# Patient Record
Sex: Female | Born: 1970 | Race: White | Hispanic: No | Marital: Married | Smoking: Current every day smoker
Health system: Southern US, Community
[De-identification: ages and names within clinical notes are randomized; demographics above are authoritative.]

## PROBLEM LIST (undated history)

## (undated) DIAGNOSIS — N280 Ischemia and infarction of kidney: Secondary | ICD-10-CM

## (undated) HISTORY — DX: Ischemia and infarction of kidney: N28.0

---

## 2002-06-17 HISTORY — PX: KNEE SURGERY: SHX244

## 2004-04-19 ENCOUNTER — Ambulatory Visit: Payer: Self-pay | Admitting: Orthopaedic Surgery

## 2004-06-17 HISTORY — PX: OVARY SURGERY: SHX727

## 2010-11-05 ENCOUNTER — Emergency Department: Payer: Self-pay | Admitting: Emergency Medicine

## 2012-03-03 ENCOUNTER — Ambulatory Visit: Payer: Self-pay

## 2012-06-17 HISTORY — PX: CHOLECYSTECTOMY: SHX55

## 2012-08-16 ENCOUNTER — Emergency Department: Payer: Self-pay | Admitting: Emergency Medicine

## 2012-08-17 LAB — URINALYSIS, COMPLETE
Bilirubin,UR: NEGATIVE
Ketone: NEGATIVE
Nitrite: POSITIVE
Ph: 6 (ref 4.5–8.0)
Protein: 100
RBC,UR: 58 /HPF (ref 0–5)
Squamous Epithelial: 2

## 2012-08-17 LAB — COMPREHENSIVE METABOLIC PANEL
Albumin: 3.6 g/dL (ref 3.4–5.0)
Alkaline Phosphatase: 111 U/L (ref 50–136)
Anion Gap: 7 (ref 7–16)
BUN: 13 mg/dL (ref 7–18)
Bilirubin,Total: 0.7 mg/dL (ref 0.2–1.0)
Calcium, Total: 9.4 mg/dL (ref 8.5–10.1)
Chloride: 100 mmol/L (ref 98–107)
Co2: 26 mmol/L (ref 21–32)
Creatinine: 0.9 mg/dL (ref 0.60–1.30)
EGFR (African American): >60
EGFR (Non-African Amer.): >60
Osmolality: 268 (ref 275–301)
Sodium: 133 mmol/L — ABNORMAL LOW (ref 136–145)
Total Protein: 8.5 g/dL — ABNORMAL HIGH (ref 6.4–8.2)

## 2012-08-17 LAB — CBC WITH DIFFERENTIAL/PLATELET
Eosinophil #: 0 10*3/uL (ref 0.0–0.7)
HCT: 43.4 % (ref 35.0–47.0)
Lymphocyte %: 6.4 %
MCH: 31.4 pg (ref 26.0–34.0)
MCV: 93 fL (ref 80–100)
Monocyte #: 0.8 x10 3/mm (ref 0.2–0.9)
Neutrophil %: 87.9 %
Platelet: 201 10*3/uL (ref 150–440)
RBC: 4.65 10*6/uL (ref 3.80–5.20)
RDW: 12.7 % (ref 11.5–14.5)
WBC: 14.5 10*3/uL — ABNORMAL HIGH (ref 3.6–11.0)

## 2012-08-18 ENCOUNTER — Inpatient Hospital Stay: Payer: Self-pay | Admitting: Family Medicine

## 2012-08-18 LAB — URINALYSIS, COMPLETE
Bilirubin,UR: NEGATIVE
Ketone: NEGATIVE
Ph: 6 (ref 4.5–8.0)
RBC,UR: 47 /HPF (ref 0–5)
Specific Gravity: 1.018 (ref 1.003–1.030)
WBC UR: 102 /HPF (ref 0–5)

## 2012-08-18 LAB — COMPREHENSIVE METABOLIC PANEL
Albumin: 3 g/dL — ABNORMAL LOW (ref 3.4–5.0)
Alkaline Phosphatase: 94 U/L (ref 50–136)
Anion Gap: 5 — ABNORMAL LOW (ref 7–16)
BUN: 9 mg/dL (ref 7–18)
Calcium, Total: 8.6 mg/dL (ref 8.5–10.1)
Creatinine: 0.85 mg/dL (ref 0.60–1.30)
EGFR (Non-African Amer.): >60
Glucose: 95 mg/dL (ref 65–99)
Osmolality: 261 (ref 275–301)
Potassium: 3.4 mmol/L — ABNORMAL LOW (ref 3.5–5.1)
SGOT(AST): 28 U/L (ref 15–37)
SGPT (ALT): 22 U/L (ref 12–78)
Sodium: 131 mmol/L — ABNORMAL LOW (ref 136–145)
Total Protein: 8.1 g/dL (ref 6.4–8.2)

## 2012-08-18 LAB — CBC
HCT: 38.3 % (ref 35.0–47.0)
MCHC: 35.2 g/dL (ref 32.0–36.0)
MCV: 92 fL (ref 80–100)
Platelet: 155 10*3/uL (ref 150–440)
RBC: 4.18 10*6/uL (ref 3.80–5.20)
WBC: 9.2 10*3/uL (ref 3.6–11.0)

## 2012-08-18 LAB — LIPASE, BLOOD: Lipase: 122 U/L (ref 73–393)

## 2012-08-20 LAB — BASIC METABOLIC PANEL
BUN: 6 mg/dL — ABNORMAL LOW (ref 7–18)
Co2: 25 mmol/L (ref 21–32)
EGFR (African American): >60
EGFR (Non-African Amer.): >60
Glucose: 79 mg/dL (ref 65–99)
Osmolality: 270 (ref 275–301)
Potassium: 3.6 mmol/L (ref 3.5–5.1)
Sodium: 137 mmol/L (ref 136–145)

## 2012-08-20 LAB — CBC WITH DIFFERENTIAL/PLATELET
Basophil #: 0 10*3/uL (ref 0.0–0.1)
Basophil %: 0.3 %
Eosinophil #: 0 10*3/uL (ref 0.0–0.7)
HGB: 11.1 g/dL — ABNORMAL LOW (ref 12.0–16.0)
Lymphocyte #: 0.8 10*3/uL — ABNORMAL LOW (ref 1.0–3.6)
Lymphocyte %: 25.7 %
MCH: 31.7 pg (ref 26.0–34.0)
MCHC: 34.7 g/dL (ref 32.0–36.0)
MCV: 91 fL (ref 80–100)
Monocyte %: 10.1 %
Platelet: 142 10*3/uL — ABNORMAL LOW (ref 150–440)
RBC: 3.51 10*6/uL — ABNORMAL LOW (ref 3.80–5.20)
RDW: 12.6 % (ref 11.5–14.5)
WBC: 3.1 10*3/uL — ABNORMAL LOW (ref 3.6–11.0)

## 2012-08-20 LAB — URINE CULTURE

## 2012-08-22 LAB — CULTURE, BLOOD (SINGLE)

## 2012-11-04 ENCOUNTER — Ambulatory Visit: Payer: Self-pay | Admitting: Family Medicine

## 2012-11-05 ENCOUNTER — Ambulatory Visit (INDEPENDENT_AMBULATORY_CARE_PROVIDER_SITE_OTHER): Payer: 59 | Admitting: General Surgery

## 2012-11-05 ENCOUNTER — Encounter: Payer: Self-pay | Admitting: General Surgery

## 2012-11-05 ENCOUNTER — Ambulatory Visit: Payer: Self-pay | Admitting: General Surgery

## 2012-11-05 VITALS — BP 126/70 | HR 88 | Resp 16 | Ht 65.0 in | Wt 125.0 lb

## 2012-11-05 DIAGNOSIS — K810 Acute cholecystitis: Secondary | ICD-10-CM | POA: Insufficient documentation

## 2012-11-05 DIAGNOSIS — K8000 Calculus of gallbladder with acute cholecystitis without obstruction: Secondary | ICD-10-CM | POA: Insufficient documentation

## 2012-11-05 NOTE — Patient Instructions (Addendum)
The patient is aware to call back for any questions or concerns. Laparoscopic cholecystectomy booklet

## 2012-11-05 NOTE — Progress Notes (Signed)
Patient ID: Katelyn Green, female   DOB: January 17, 1971, 42 y.o.   MRN: [id number]  Chief Complaint  Patient presents with  . Abdominal Pain    HPI Christiona Siddique is a 42 y.o. female.   Patient here today for evaluation of her gall gladder referred by Dr Dayna Barker.  Patient complaints of abdominal pain for about 1 week upper right quadrant and nausea.  She has bloating as well. She has had a few episodes since January that only lasted a couple of days but this one seems to be lasting longer.  CT done 11-04-12 that showed gall stones. She had an episode 2 years ago where she went to the ER. HPI  Past Medical History  Diagnosis Date  . Kidney infarction     Past Surgical History  Procedure Laterality Date  . Knee surgery  2004  . Ovary surgery Right 2006    History reviewed. No pertinent family history.  Social History History  Substance Use Topics  . Smoking status: Current Every Day Smoker  . Smokeless tobacco: Never Used  . Alcohol Use: Yes    Allergies  Allergen Reactions  . Codeine Nausea And Vomiting    Current Outpatient Prescriptions  Medication Sig Dispense Refill  . Norgestimate-Eth Estradiol (SPRINTEC 28 PO) Take by mouth.      . venlafaxine (EFFEXOR) 25 MG tablet Take 25 mg by mouth 2 (two) times daily.       No current facility-administered medications for this visit.    Review of Systems Review of Systems  Constitutional: Negative.   Respiratory: Negative.   Cardiovascular: Negative.     Blood pressure 126/70, pulse 88, resp. rate 16, height 5\' 5"  (1.651 m), weight 125 lb (56.7 kg), last menstrual period 10/27/2012.  Physical Exam Physical Exam  Constitutional: She is oriented to person, place, and time. She appears well-developed and well-nourished.  Eyes: Conjunctivae are normal.  Cardiovascular: Normal rate and regular rhythm.   Pulmonary/Chest: Effort normal and breath sounds normal.  Abdominal: Soft. There is tenderness. A hernia is present.   Neurological: She is alert and oriented to person, place, and time.  Skin: Skin is warm and dry.  mild moderate tenderness RUQ small umbilical hernia  Data Reviewed CT reviewed -shows distended gall bladder with wall thickening with pericholocystic fluid. Prior US from 2012 showed gallstones  Assessment    Acute cholecystitis with cholelithiasis     Plan    Plan surgery. Discussed lap/cholecystectomy in full with pt.       SANKAR,SEEPLAPUTHUR G 11/05/2012, 11:38 AM

## 2012-11-06 ENCOUNTER — Ambulatory Visit: Payer: Self-pay | Admitting: General Surgery

## 2012-11-06 DIAGNOSIS — K429 Umbilical hernia without obstruction or gangrene: Secondary | ICD-10-CM

## 2012-11-06 DIAGNOSIS — K8000 Calculus of gallbladder with acute cholecystitis without obstruction: Secondary | ICD-10-CM

## 2012-11-10 ENCOUNTER — Encounter: Payer: Self-pay | Admitting: General Surgery

## 2012-11-11 ENCOUNTER — Ambulatory Visit (INDEPENDENT_AMBULATORY_CARE_PROVIDER_SITE_OTHER): Payer: 59 | Admitting: General Surgery

## 2012-11-11 ENCOUNTER — Encounter: Payer: Self-pay | Admitting: General Surgery

## 2012-11-11 VITALS — BP 122/68 | HR 80 | Resp 12 | Ht 65.0 in | Wt 126.0 lb

## 2012-11-11 DIAGNOSIS — K810 Acute cholecystitis: Secondary | ICD-10-CM

## 2012-11-11 DIAGNOSIS — K8000 Calculus of gallbladder with acute cholecystitis without obstruction: Secondary | ICD-10-CM

## 2012-11-11 NOTE — Patient Instructions (Addendum)
Patient to use a laxative prn-dulcolax MOM or Miralax.  If labs are normal, no further w/u for ruq soreness. May want to try some AIs-Aleve or Advil.

## 2012-11-11 NOTE — Progress Notes (Signed)
Patient ID: Katelyn Green, female   DOB: [birth year], 42 y.o.   MRN: [id number]  Chief Complaint  Patient presents with  . Routine Post Op    gallbladder    HPI Katelyn Green is a 42 y.o. female here today for her post op gallbladder surgery done 11/06/12. Patient states she has not had a bowel movement since last Tuesday. C/o focal soreness near lateral portsites HPI  Past Medical History  Diagnosis Date  . Kidney infarction     Past Surgical History  Procedure Laterality Date  . Knee surgery  2004  . Ovary surgery Right 2006  . Cholecystectomy  2014    History reviewed. No pertinent family history.  Social History History  Substance Use Topics  . Smoking status: Current Every Day Smoker  . Smokeless tobacco: Never Used  . Alcohol Use: Yes    Allergies  Allergen Reactions  . Codeine Nausea And Vomiting    Current Outpatient Prescriptions  Medication Sig Dispense Refill  . Norgestimate-Eth Estradiol (SPRINTEC 28 PO) Take by mouth.      . oxyCODONE-acetaminophen (PERCOCET/ROXICET) 5-325 MG per tablet Take 40 tablets by mouth daily as needed.      . venlafaxine (EFFEXOR) 25 MG tablet Take 25 mg by mouth 2 (two) times daily.       No current facility-administered medications for this visit.    Review of Systems Review of Systems  Constitutional: Negative.   Respiratory: Negative.   Cardiovascular: Negative.   Gastrointestinal: Positive for abdominal pain and constipation. Negative for nausea, vomiting, diarrhea and abdominal distention.    Blood pressure 122/68, pulse 80, resp. rate 12, height 5\' 5"  (1.651 m), weight 126 lb (57.153 kg), last menstrual period 10/27/2012.  Physical Exam Physical Exam  Constitutional: She appears well-developed and well-nourished.  Cardiovascular: Normal rate, regular rhythm and normal heart sounds.   Pulmonary/Chest: Effort normal and breath sounds normal.  Abdominal: Soft. Bowel sounds are normal. There is tenderness ( on  lateral port site  ).  t Data Reviewed None   Assessment    Overall doing well, soreness RUQ might be muscular.     Plan   CBC and liver functions        Katelyn Green 11/12/2012, 9:43 AM

## 2012-11-12 ENCOUNTER — Encounter: Payer: Self-pay | Admitting: General Surgery

## 2012-11-12 ENCOUNTER — Telehealth: Payer: Self-pay | Admitting: *Deleted

## 2012-11-12 LAB — CBC WITH DIFFERENTIAL/PLATELET
Basophils Absolute: 0 10*3/uL (ref 0.0–0.2)
Basos: 0 % (ref 0–3)
Eosinophils Absolute: 0.1 10*3/uL (ref 0.0–0.4)
Immature Grans (Abs): 0 10*3/uL (ref 0.0–0.1)
Immature Granulocytes: 0 % (ref 0–2)
Lymphs: 13 % — ABNORMAL LOW (ref 14–46)
MCH: 30.7 pg (ref 26.6–33.0)
Monocytes Absolute: 0.8 10*3/uL (ref 0.1–0.9)
Neutrophils Relative %: 75 % — ABNORMAL HIGH (ref 40–74)
RBC: 4.4 x10E6/uL (ref 3.77–5.28)
RDW: 12.2 % — ABNORMAL LOW (ref 12.3–15.4)
WBC: 7.8 10*3/uL (ref 3.4–10.8)

## 2012-11-12 LAB — HEPATIC FUNCTION PANEL
AST: 28 IU/L (ref 0–40)
Albumin: 3.7 g/dL (ref 3.5–5.5)
Alkaline Phosphatase: 190 IU/L — ABNORMAL HIGH (ref 39–117)
Bilirubin, Direct: 0.13 mg/dL (ref 0.00–0.40)

## 2012-11-12 NOTE — Telephone Encounter (Signed)
Message copied by Currie Paris on Thu Nov 12, 2012 10:20 AM ------      Message from: Kieth Brightly      Created: Thu Nov 12, 2012  8:24 AM       No significant lab abnormalities.       Mindi Junker, please advise pt - to call if pain in ruq is not better by next week or she develops new symptoms. Suggest use of Aleve or Advil for 4-5 days. ------

## 2012-11-12 NOTE — Telephone Encounter (Signed)
Advised patient as instructed. 

## 2012-11-12 NOTE — Telephone Encounter (Signed)
Message copied by Currie Paris on Thu Nov 12, 2012  9:30 AM ------      Message from: Kieth Brightly      Created: Thu Nov 12, 2012  8:24 AM       No significant lab abnormalities.       Mindi Junker, please advise pt - to call if pain in ruq is not better by next week or she develops new symptoms. Suggest use of Aleve or Advil for 4-5 days. ------

## 2012-12-04 ENCOUNTER — Ambulatory Visit: Payer: Self-pay | Admitting: Family Medicine

## 2013-01-08 ENCOUNTER — Encounter: Payer: Self-pay | Admitting: General Surgery

## 2014-04-18 ENCOUNTER — Encounter: Payer: Self-pay | Admitting: General Surgery

## 2014-05-18 IMAGING — CT CT ABD-PELV W/O CM
1 of 2 series · 15 of 32 positions shown, 19 images · non-contrast
Comparison: none

REASON FOR EXAM: (1) left flank pain; (2) left flank pain
COMMENTS:   May transport without cardiac monitor

PROCEDURE:     CT  - CT ABDOMEN AND PELVIS W[DATE]  [DATE]
RESULT:     Abdominal CT dated
TECHNIQUE: Helical noncontrasted 3 mm sections were obtained the lung bases
through the pubic symphysis.

[Series 2: 3mm soft tissue · axial · 0.63mm/px · z∈[-778,-384]mm · 15 of 143 slices shown, 19 images]
[im 6/143  soft-tissue]
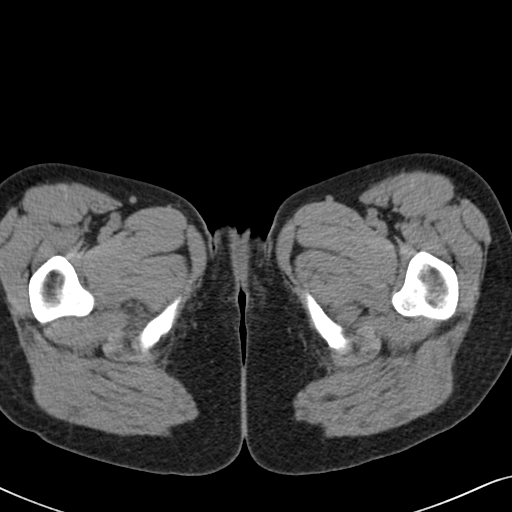
[im 6/143  bone]
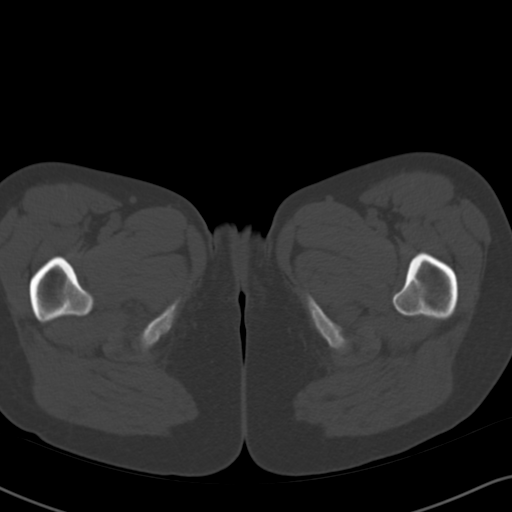
[im 17/143  soft-tissue]
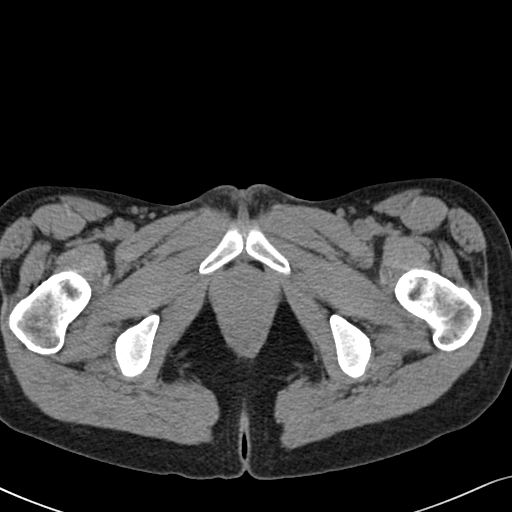
[im 28/143  soft-tissue]
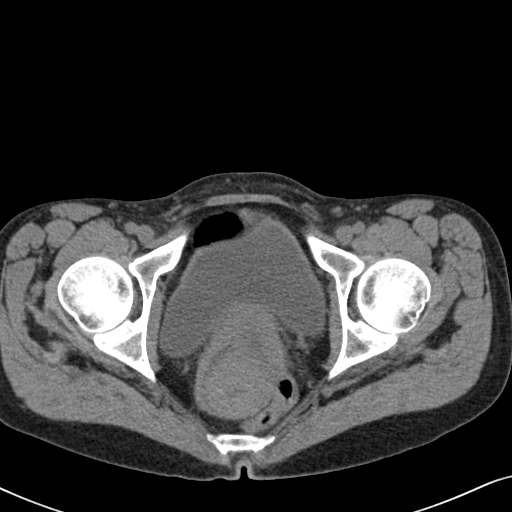
[im 39/143  soft-tissue]
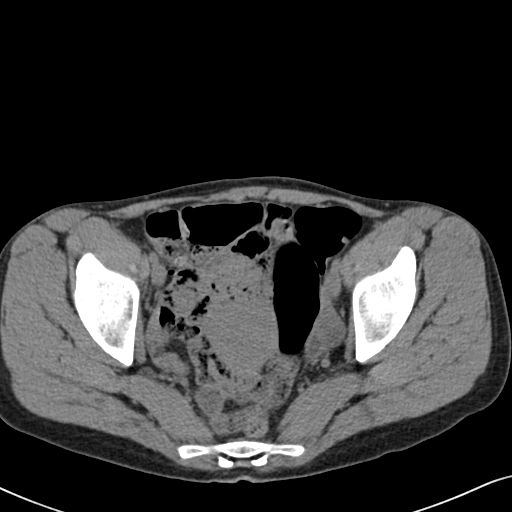
[im 50/143  soft-tissue]
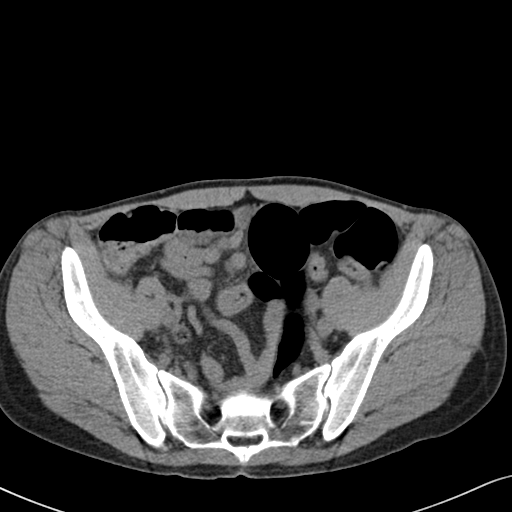
[im 61/143  soft-tissue]
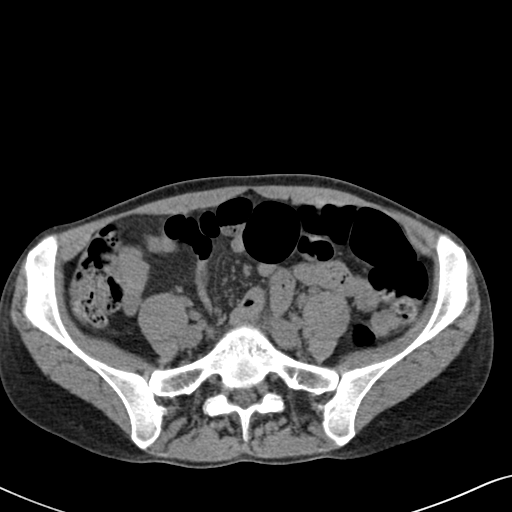
[im 72/143  soft-tissue]
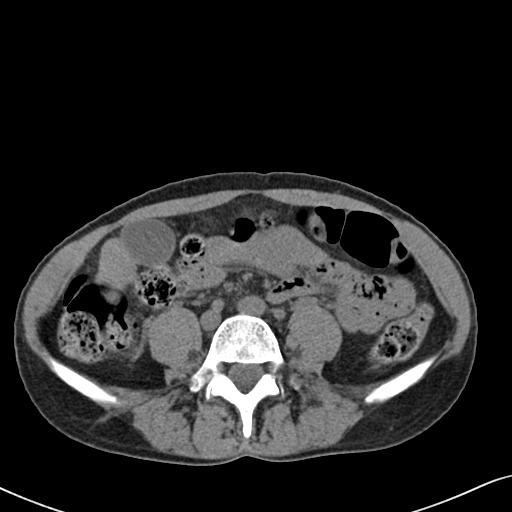
[im 82/143  soft-tissue]
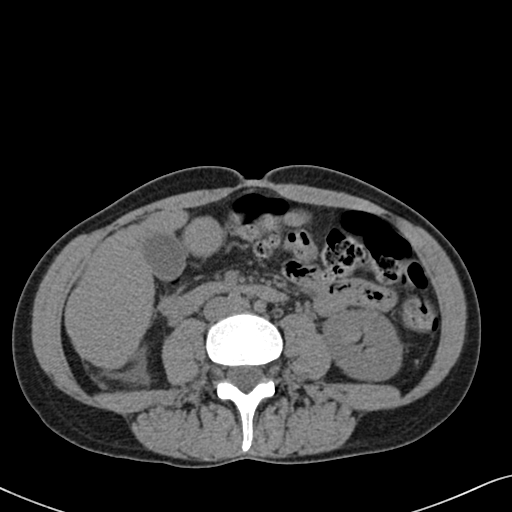
[im 93/143  soft-tissue]
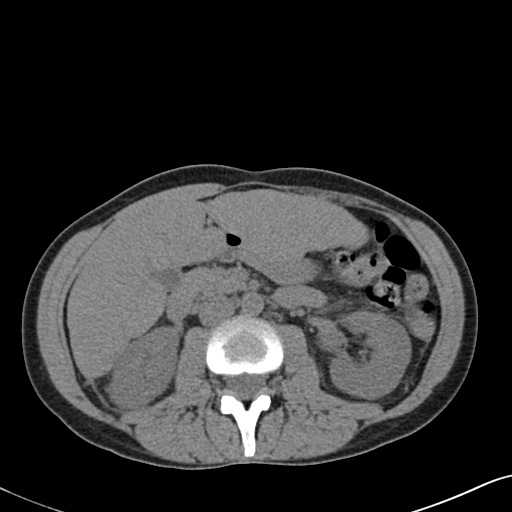
[im 93/143  bone]
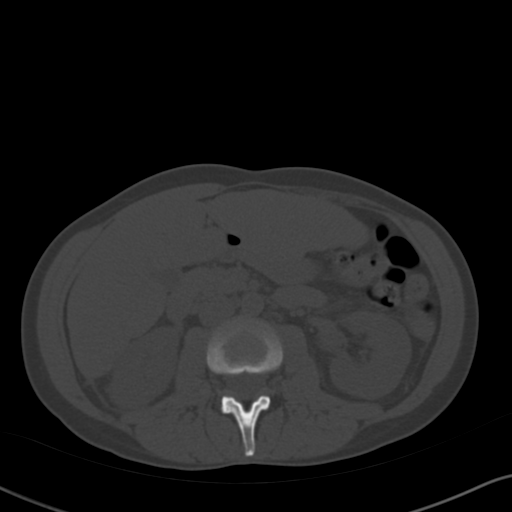
[im 104/143  soft-tissue]
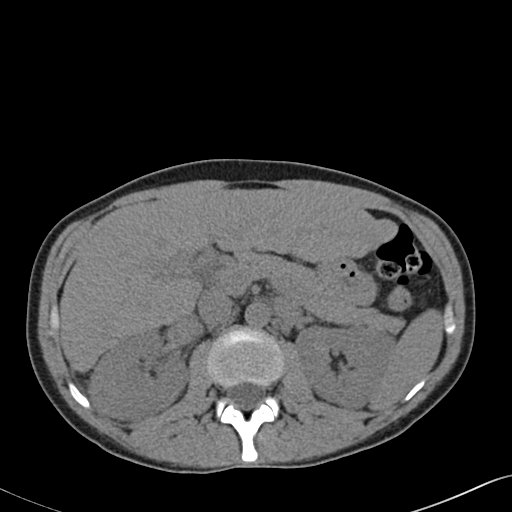
[im 115/143  soft-tissue]
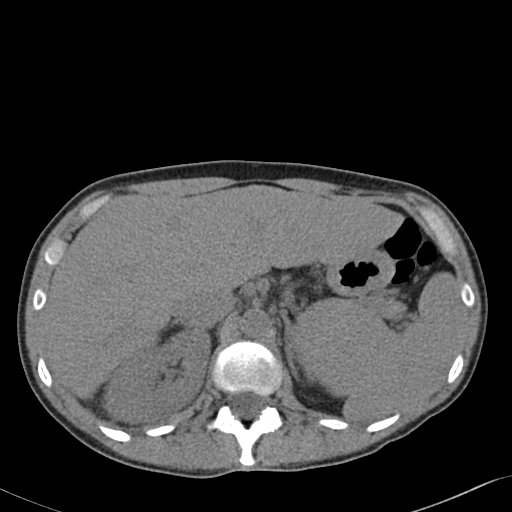
[im 121/143  lung]
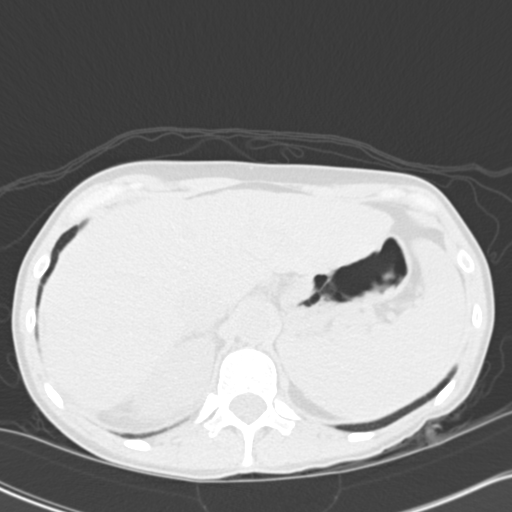
[im 126/143  soft-tissue]
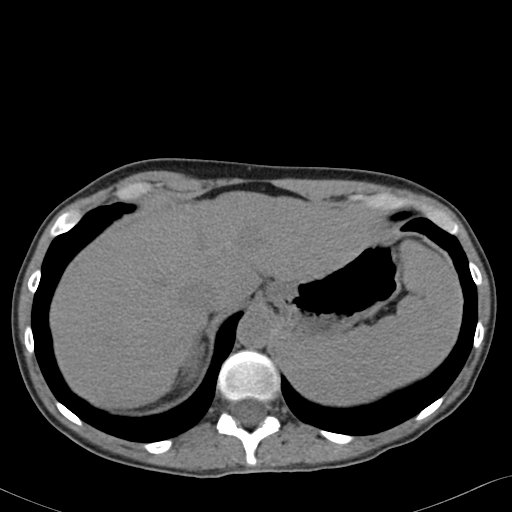
[im 126/143  lung]
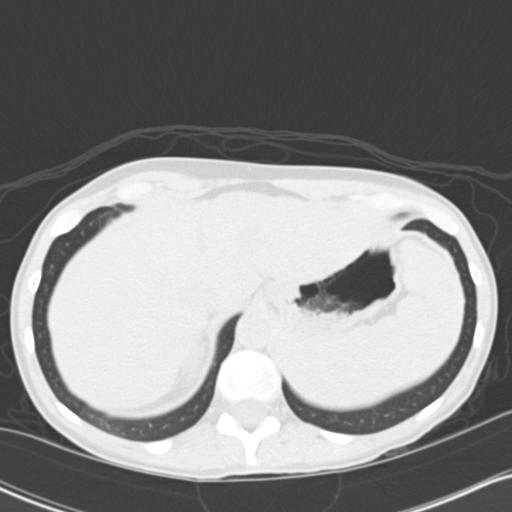
[im 132/143  lung]
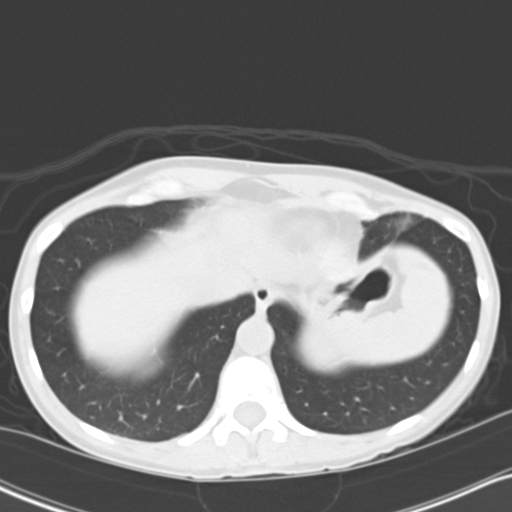
[im 137/143  soft-tissue]
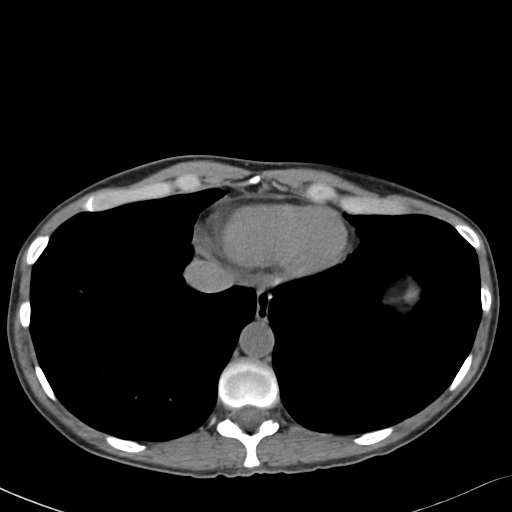
[im 137/143  lung]
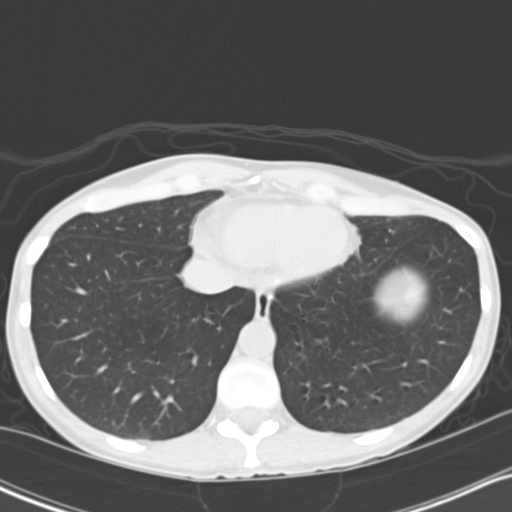

[15 of 32 positions shown; findings below may reference images not displayed]

FINDINGS: The lung bases are unremarkable.

Noncontrast evaluation of the liver, adrenals, pancreas, kidneys is
unremarkable. The spleen is prominent measuring 12.4 cm in longitudinal
dimensions. Within the posterior aspect right hilum a masslike area is
identified this may represent the normal contour of the spleen though a
focal splenic mass cannot be excluded and is poorly characterized on this
noncontrasted study.

There is no CT evidence of bowel obstruction, enteritis, colitis,
diverticulitis, nor appendicitis within the limitations of a noncontrasted
CT. There is no evidence of abdominal aortic aneurysm. There is no further
evidence of masses, loculated fluid collections nor evidence of free fluid
within the limitations of a noncontrasted CT. There are prominent
retroperitoneal and mesenteric lymph nodes. There is a moderate to large
amount of fecal retention within the colon.
IMPRESSION: 1. Prominent spleen and retroperitoneal as well as mesenteric lymph nodes as
described above clinical correlation recommended
2. Otherwise no CT evidence of obstructive or inflammatory abnormalities.

## 2014-05-20 IMAGING — CR DG CHEST 1V
1 series · 1 of 1 positions shown · non-contrast
Comparison: none

REASON FOR EXAM: shortness of breath
COMMENTS:

[ap]
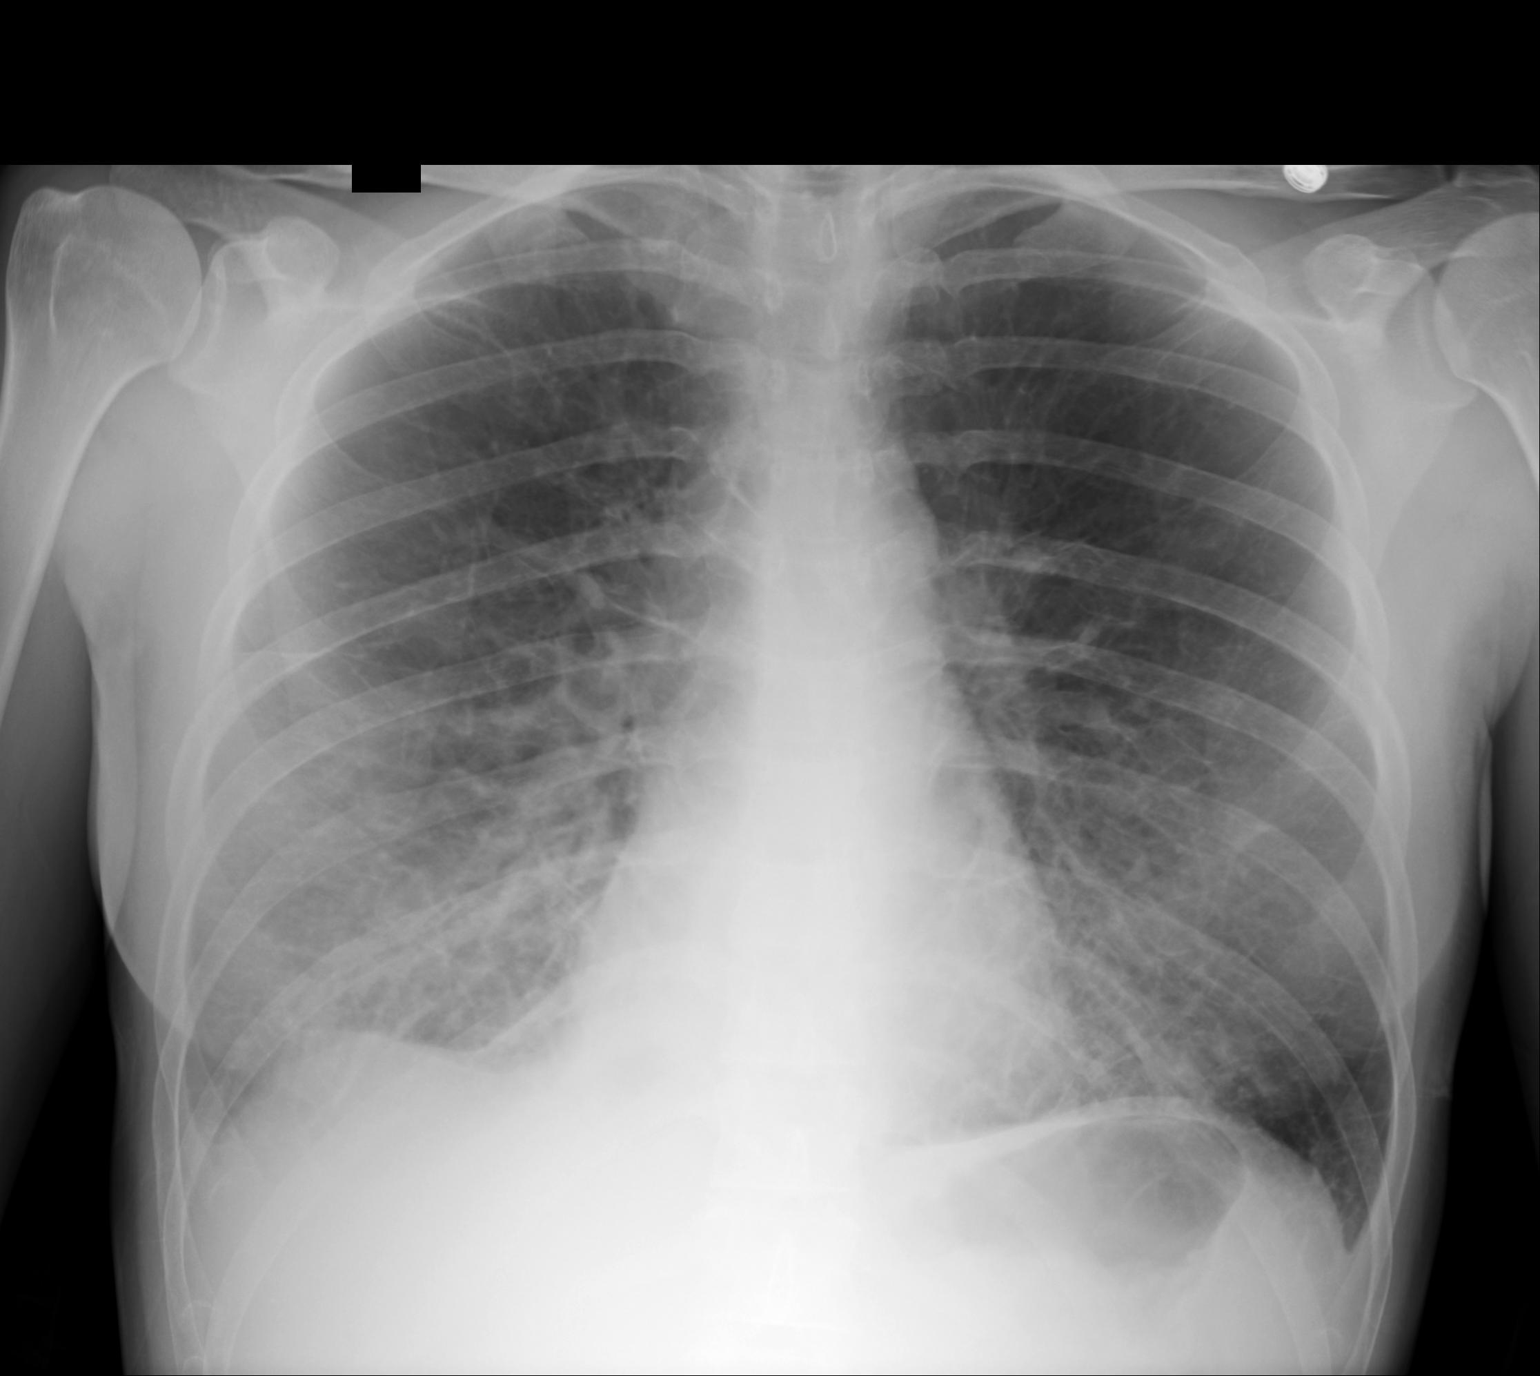

[1 of 1 positions shown; findings below may reference images not displayed]

PROCEDURE:     DXR - DXR CHEST 1 VIEWAP OR PA  - August 20, 2012  [DATE]

RESULT:     Followup PA and lateral views the chest are recommended. There
is mild diffuse interstitial prominence especially in the lower lung zones
area the heart size is normal. No definite consolidation, effusion or
pneumothorax is evident.
IMPRESSION: Fairly significant interstitial prominence especially of
the mid and lower right lung region. Some of this may be because of the
portable technique and exposure factors. Followup erect PA and lateral views
the chest would be recommended when the patient's condition permits.

[REDACTED]

## 2014-08-06 IMAGING — XA DG CHOLANGIOGRAM OPERATIVE
1 series · 2 of 2 positions shown · non-contrast
Comparison: none

REASON FOR EXAM: Cholelithiasis
COMMENTS:

[Series 6001: (person_name) · 2 of 2 slices shown]
[im 1/2]
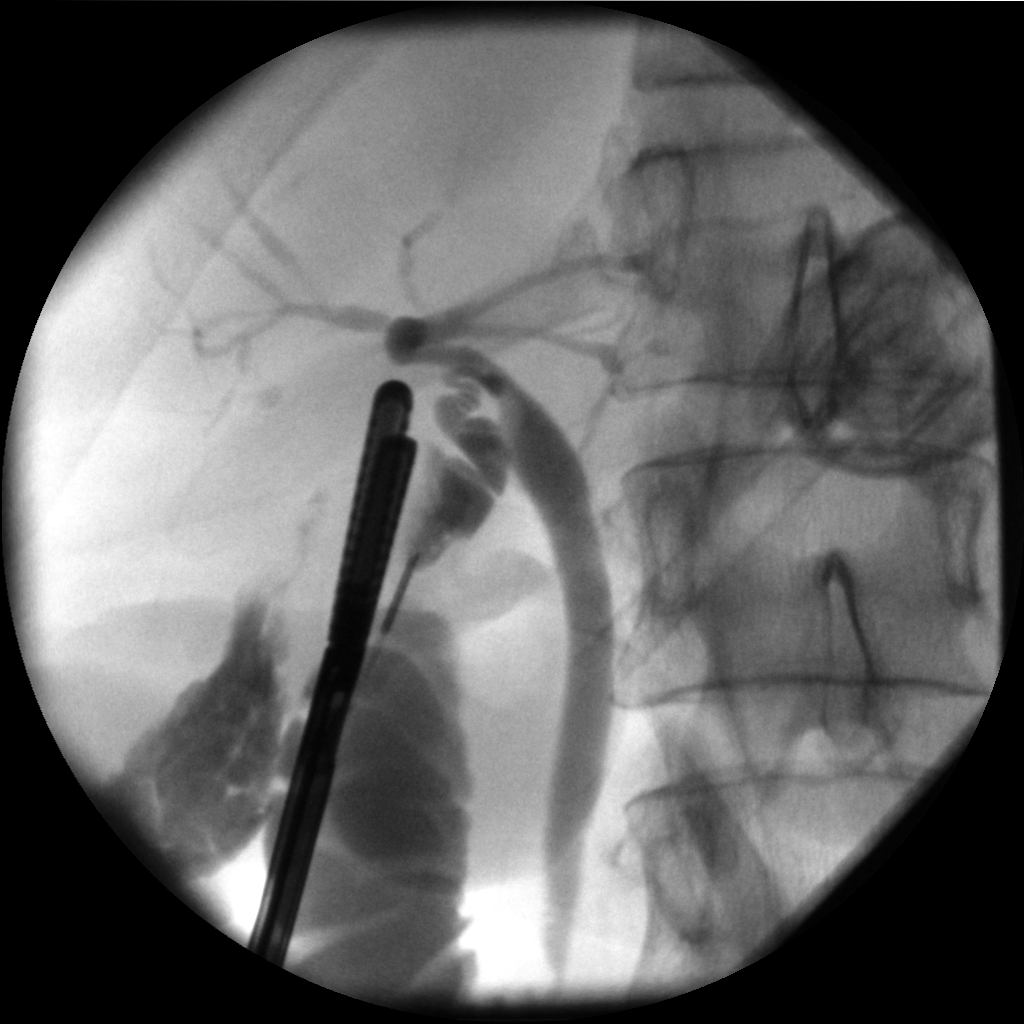
[im 2/2]
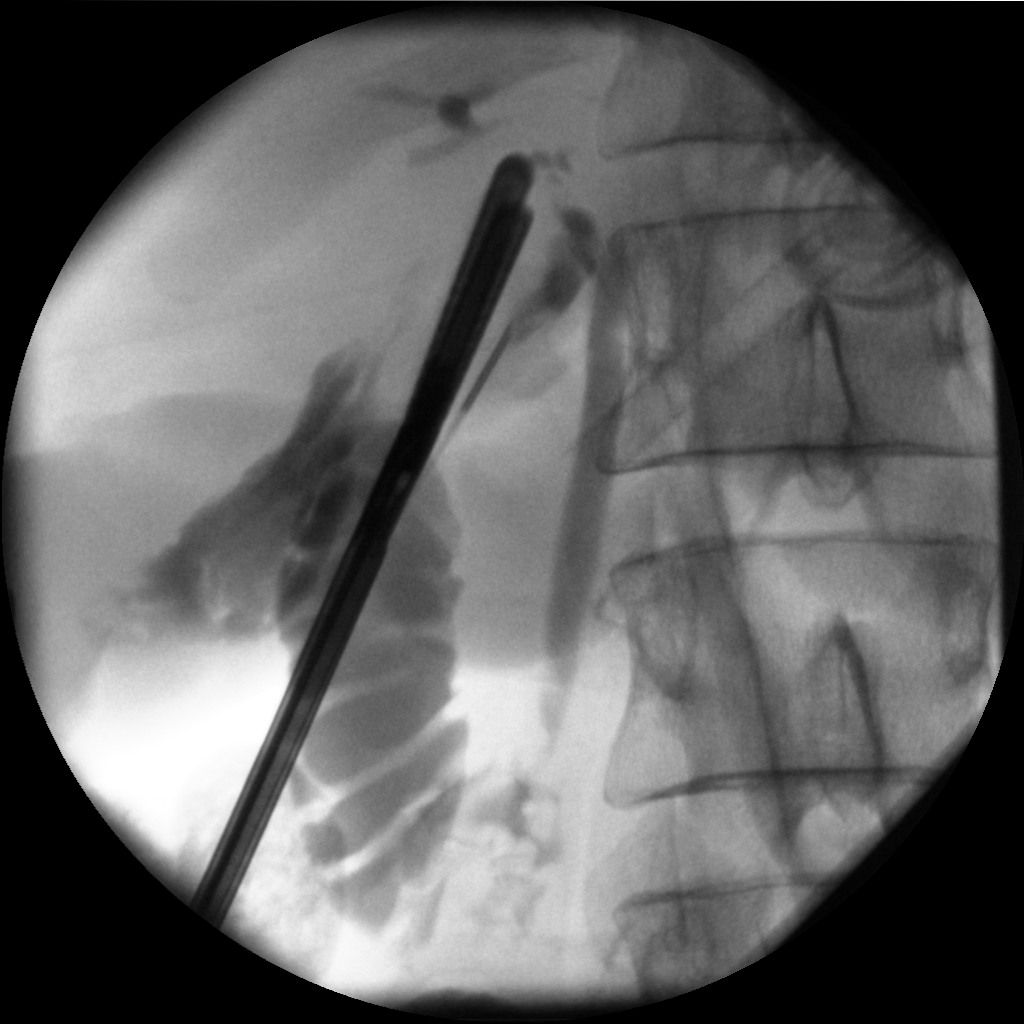

[2 of 2 positions shown; findings below may reference images not displayed]

PROCEDURE:     DXR - DXR CHOLANGIOGRAM OP (INITIAL)  - November 06, 2012 [DATE]

RESULT:     Two spot films from a laparoscopic cholecystectomy are
submitted. The visualized portions of the intrahepatic ducts and common
hepatic duct appear normal. The common bile duct is normal and diameter and
contour. No filling defects are demonstrated to suggest a retained stone.
There is contrast within the cystic duct and within the duodenum.
IMPRESSION: I do not see evidence of a retained common bile duct stone.
Further interpretation is deferred to Dr. Stephen Vincent

[REDACTED].

## 2014-10-07 NOTE — H&P (Signed)
PATIENT NAME:  Katelyn Green, Katelyn Green MR#:  [id number] DATE OF BIRTH:  [birth year]  DATE OF ADMISSION:  08/18/2012  PRIMARY CARE PHYSICIAN: Benjamine Mola B. White, NP   CHIEF COMPLAINT: Nausea, vomiting and abdominal pain.   HISTORY OF PRESENT ILLNESS: A 44 year old female patient with history of depression who presents to the Emergency Room complaining of intractable nausea, vomiting and left flank and back pain radiating to the groin. The patient was seen yesterday in the Emergency Room on 08/17/2012 with similar complaints, was sent home after being diagnosed with UTI and pyelonephritis on ciprofloxacin. She also got a prescription for Percocet. The patient has been unable to keep any food or liquids down. She could not keep her antibiotics down. Returned to the Emergency Room and is being admitted to the hospitalist service for aggressive IV fluids and IV antibiotics, which she cannot get at home. The patient is afebrile. Her white count is improved, but urinalysis still show UTI. The patient had a CT scan of the abdomen done without contrast which shows no stones, no perinephric stranding or abscess, but incidental finding of splenomegaly of about 12 cm.   The patient's pain is in the left flank/back radiating to the groin with no aggravating or relieving factors. This pain has not improved since yesterday. She has not noticed any hematemesis in the vomiting. Has dark urine but no hematuria. Her burning on urination has definitely improved.   PAST MEDICAL HISTORY: Depression.   FAMILY HISTORY: Hypertension.   SOCIAL HISTORY: The patient smokes a pack a day. No alcohol. No illicit drugs.   REVIEW OF SYSTEMS:    CONSTITUTIONAL: Complains of fever, fatigue, weakness. No weight loss, weight gain.  EYES: No blurred vision, pain, redness.  ENT: No tinnitus, ear pain, hearing loss.  RESPIRATORY: No cough, wheeze, hemoptysis.  CARDIOVASCULAR: No chest pain, orthopnea, edema.  GASTROINTESTINAL: Has nausea,  vomiting. No diarrhea. Does have abdominal pain.  GENITOURINARY: Had dysuria which has resolved. No hematuria or frequency.  ENDOCRINE: No polyuria, nocturia, thyroid problems.  HEMATOLOGIC AND LYMPHATIC: No anemia, easy bruising, bleeding.  INTEGUMENTARY: No acne, rash, lesions.  MUSCULOSKELETAL: No arthritis, gout, redness.  NEUROLOGIC: No focal numbness, weakness, dysarthria, seizures.  PSYCHIATRIC: Has depression. No anxiety.   HOME MEDICATIONS: Include: 1.  Effexor 300 mg oral once a day.  2.  Percocet 5/325, 1 tablet oral every 6 hours.  3.  Zofran ODT 4 mg oral 3 times a day as needed.  4.  Cipro 500 mg oral 2 times a day for 10 days.  5.  Sprintec 35/0.25 mg oral once a day.   PHYSICAL EXAMINATION:  VITAL SIGNS: Temperature 99.1, pulse 116, respirations 20, blood pressure 113/76, saturating 99% on room air.  GENERAL: Moderately built Caucasian female patient lying in bed in distress secondary to her pain and nausea.  PSYCHIATRIC: Alert, oriented x 3. Mood and affect appropriate. Judgment intact.  HEENT: Atraumatic, normocephalic. Oral mucosa dry and pink. No oral ulcers or thrush. External ears and nose normal. No pallor. No icterus. Pupils bilaterally equal and reactive to light.  NECK: Supple. No thyromegaly. No palpable lymph nodes. Trachea midline. No carotid bruit or JVD.  CARDIOVASCULAR: S1, S2, tachycardic without any murmurs. Peripheral pulses 2+.  RESPIRATORY: Normal work of breathing. Clear to auscultation on both sides.  GASTROINTESTINAL: Soft abdomen. Tenderness in the left flank area without any rigidity or guarding. Bowel sounds present.  GENITOURINARY: No CVA tenderness. Has suprapubic pain. No bladder distention.  SKIN: Warm and dry. No  petechiae, rash, ulcers.  MUSCULOSKELETAL: No joint swelling, redness, effusion of the large joints. Normal muscle tone.  NEUROLOGICAL: Motor strength 5/5 in upper and lower extremities. Sensation to fine touch intact all over.   LYMPHATIC: No cervical lymphadenopathy.   LABORATORY, DIAGNOSTIC AND RADIOLOGICAL DATA: Glucose 95, BUN 9, creatinine 0.85 with sodium 131, potassium 3.4. AST, ALT, alkaline phosphatase, bilirubin normal. WBC 9.2, hemoglobin 13.5 with platelets of 155. Urine culture from 08/17/2012 is growing gram-negative rods with blood cultures being negative. Urinalysis repeated today shows 102 WBCs, 2+ bacteria and 47 RBC. Pregnancy test done yesterday was negative.   CT scan of the abdomen and pelvis without contrast shows a prominent spleen and retroperitoneal and mesenteric lymph nodes. Moderate to large amount of fecal retention within the colon.   ASSESSMENT AND PLAN:  1.  Gram-negative rod urinary tract infection with associated nausea and vomiting: The nausea, vomiting I suspect is secondary to urinary tract infection and the pain, although significant amount of fecal matter found in the colon and constipation cannot be ruled out as a cause. Will admit the patient for inpatient treatment, as the patient is unable to keep her antibiotics down, is tachycardic and severely dehydrated. Will start her on aggressive intravenous fluids with 150 mL NR. After a bolus, start her on intravenous ceftriaxone and follow cultures to wait for identification and sensitivities from the urine. Also wait for final blood culture results. The patient does not have any costovertebral angle tenderness. No perinephric stranding or renal abscess.  2.  Splenomegaly, mild: No abnormalities found on the CBC. No prior history of blood dyscrasia or spleen issues. Will need to be monitored. Followup CT of the abdomen with contrast as outpatient if no improvement of symptoms with treatment of the urinary tract infection.  3.  Depression: Continue Effexor.  4.  Severe dehydration secondary to nausea, vomiting.  5.  Hyponatremia and hypokalemia secondary to dehydration and decreased intake: Replace.  6.  Deep vein thrombosis prophylaxis with  Lovenox.  Tobacco abuse- pt couselled to quit > 3 min 7.  Full code.   TIME SPENT: Time spent today on this case was 45 minutes.   ____________________________ Leia Alf Sudini, MD srs:jm D: 08/18/2012 17:59:41 ET T: 08/18/2012 18:23:49 ET JOB#: [id number]  cc: Alveta Heimlich R. Sudini, MD, <Dictator> Dani Gobble. Dema Severin, NP Alveta Heimlich Arlice Colt MD ELECTRONICALLY SIGNED 08/18/2012 19:15

## 2014-10-07 NOTE — Discharge Summary (Signed)
DATE OF BIRTH:  1971/04/28  DATE OF ADMISSION:  08/18/2012  DATE OF DISCHARGE:  08/20/2012   REASON FOR ADMISSION: Nausea, vomiting and abdominal pain.  DISCHARGE DIAGNOSES:  1.  Urinary tract infection. 2.  E. Coli sensitive to multiple antibiotics. 3.  Atelectasis. 4.  Hypokalemia. 5.  Right lower quadrant abdominal pain due to UTI.  6.  Infected spleen and lymph nodes due to infectious process.   7.  Hyponatremia due to intravascular volume depletion.   DISPOSITION:  Home.   MEDICATIONS AT DISCHARGE:  Zofran 4 mg 3 times a day as needed, Effexor XL 300 mg once a day, Sprintec 35/0.25 mg tablet once a day, acetaminophen p.r.n., hydrocodone with acetaminophen, Norco 5/325 mg take one 4-6 hours as needed for pain, Ceftin 500 mg twice daily,  FOLLOWUP:  With Dr. Dema Severin, family physician, in 1 to 2 weeks.   IMPORTANT RESULTS:  CT of the abdomen and pelvis without any signs of appendicitis. There is some slight enlargement of the spleen and retroperitoneal as well as mesenteric lymph nodes, otherwise was negative. There is enlargement of the spleen and mesenteric and retroperitoneal lymph nodes, could be related to the infectious process, as the patient had significant systemic inflammatory syndrome due to UTI, although we recommend to followup a CT scan of the abdomen and pelvis within the next 3 to 6 months. Dr. Dema Severin, primary care physician, to follow up on those results and order the tests.   Other results:  Glucose 121, creatinine 0.9 on admission, 0.6 at discharge, sodium 131, up to 137 after hydration. Total protein 8.5, albumin after hydration was 3.0. LFTs within normal limits. White blood count of 14.5 on admission, second day 9.2, at discharge 3.1.   Labs on discharge: White count 3.1, hematocrit 32, hemoglobin 11.1, likely due to dilution, as the platelets were up to 142. Urine culture grew E. coli sensitive to most of the classes of antibiotics. The patient was previously taking  ciprofloxacin, for what she is discharged on Ceftin.  Pregnancy test is negative.   HOSPITAL COURSE: The patient is a very nice, 44 year old female who has a history of depression, for the most controlled, presented to the Emergency Room complaining of intractable nausea and vomiting, left flank and back pain radiating to the groin. The patient was prior seen in the Emergency Room the day before with similar complaints, was diagnosed with UTI, and was prescribed ciprofloxacin and sent home with a prescription of Percocet as well. The patient developed significant nausea and vomiting after that, was not able to keep anything down, not even her antibiotics, for what she returned to be evaluated at the ER. Once she returned, she was afebrile. Her white count was 14,000, now it was 9.2, but her urinalysis does show a UTI. CT of the abdomen and pelvis was done, showing no signs of pyelonephritis, no signs of appendicitis, no abscess or acute findings. The only incidental finding was the splenomegaly with mesenteric lymph nodes, for what was likely the patient had reactive  reaction from the infection.   MEDICAL PROBLEMS: 1. Urinary tract infection. The patient was admitted with significant systemic symptoms, increased white blood cells and fever prior day. Was put on ciprofloxacin, sent home, but had to return due to increase of the symptomatology. The patient was found to be tachycardic and severely dehydrated, with low sodium, for which she was given intravenous fluids at 110 mL an hour. Those were decreased to 100 mL an hour the following day,  and stopped the day of discharge. The patient recovered very well, and her cultures grew out E. coli, which was pansensitive. She was discharged on Ceftin.   2.  Splenomegaly. The patient has no history of tumors, lymphoma, or any other blood dyscrasias. This is likely due to reactive disease from the infection, but we recommend to follow up with a CT of the abdomen as an  outpatient in the next 3 months after the UTI symptoms have resolved.   3.  Depression. It was stable during this hospitalization.   4.  Dehydration, with hyponatremia. Resolved after IV fluids were given.  5. Tobacco abuse. The patient was counseled during this hospitalization, but she is not ready to quit smoking.   6.  Other medical problems were stable.   I spent about 40 minutes on the day of discharge with the patient.     ____________________________ [place] Sink, MD rsg:mr D: 08/22/2012 15:08:00 ET T: 08/22/2012 19:49:45 ET JOB#: [id number]  cc: [place] Sink, MD, <Dictator> Dani Gobble. Dema Severin, NP  ROBERTO America Brown MD ELECTRONICALLY SIGNED 09/01/2012 13:03

## 2014-10-07 NOTE — Op Note (Signed)
PATIENT NAME:  Katelyn Green, Katelyn Green MR#:  [id number] DATE OF BIRTH:  04-Jan-1971  DATE OF PROCEDURE:  11/06/2012  PREOPERATIVE DIAGNOSIS:  Acute cholecystitis with cholelithiasis.   POSTOPERATIVE DIAGNOSIS:  Acute cholecystitis with cholelithiasis.   OPERATION:  Laparoscopy, cholecystectomy with intraoperative cholangiogram.   SURGEON:  Mckinley Jewel, MD  ANESTHESIA: General.   COMPLICATIONS:  None.   ESTIMATED BLOOD LOSS:   Less than 15 mL.  DRAINS:  None.  PROCEDURE:  The patient was put to sleep in a supine position on the operating table. The abdomen was prepped and draped out as a sterile field. The patient had a small umbilical hernia located in the pit of the umbilicus with a defect that was less than a centimeter noted. An incision was made overlying this defect, carefully deepened through to expose a preperitoneal fatty tissue that was excised out with cautery. The fascial edges were pulled up and a Veress needle with the InnerDyne sleeve was positioned in the peritoneal cavity and verified with a hanging drop method. Pneumoperitoneum was obtained and a 10-mm portal was placed. The camera was introduced with good visualization. Epigastric and two lateral 5-mm ports were placed. The liver appeared to be diffusely nodular in pattern but did not appear to be firm in consistency. The gallbladder was acutely inflamed, extremely thick and distended. A needle trocar was then used to decompress the gallbladder and noted to contain essentially thick, mucoid material consistent with chronic cystic duct obstruction with acute changes on top. After decompression, there was easier manipulation. The gallbladder was distracted cephalad. The Hartmann's pouch area was then dissected. The cystic duct was identified and also the common bile duct and the hepatic artery the cystic artery. The cystic duct was freed. A stone occupying the Hartmann's  pouch was gently milked upward to allow for placement of a Kumar  clamp and a catheter. The cholangiogram was performed which showed good filling of the bile ducts, both proximal and distal, with no evidence of obstruction or filling defects. We decompressed the gallbladder to remove. The cystic duct was Hemoclipped and cut. The cystic artery was identified, freed, Hemoclipped and cut and the gallbladder was dissected from  its bed using cautery for control of bleeding. Given the both chronic and acute changes, there was a moderate amount of oozing encountered along the gallbladder bed, but no major bleeding. After this was freed, the area was irrigated and fluid suctioned out. The gallbladder bed was then covered with 5 mL of Surgicel containing thrombin. After ensuring hemostasis, the camera was placed at the epigastric port site through a 5-mm scope. The gallbladder was placed in a retrieval bag and brought out through the umbilical port site. To facilitate removal, both the skin and the fascial incisions were extended slightly. A single large stone was identified, about a 2 cm in size. The pneumoperitoneum was then released and the ports were removed. The fascial opening at the umbilicus was closed with 2 figure-of-eight stitches of 0 Prolene. All the skin incisions were closed with subcuticular 4-0 Vicryl reinforced with Steri-Strips and a dry sterile dressing was placed. The patient tolerated the procedure well with no immediate problems encountered. She was subsequently extubated and returned to the Recovery Room in stable condition.   ____________________________ S.Robinette Haines, MD sgs:jm D: 11/07/2012 08:08:13 ET T: 11/07/2012 14:24:10 ET JOB#: [id number]  cc: Synthia Innocent. Jamal Collin, MD, <Dictator> [facility] Robinette Haines MD ELECTRONICALLY SIGNED 11/09/2012 7:32

## 2015-10-01 ENCOUNTER — Emergency Department
Admission: EM | Admit: 2015-10-01 | Discharge: 2015-10-01 | Disposition: A | Discharge status: Home / Self Care | Payer: 59 | Attending: Emergency Medicine | Admitting: Emergency Medicine

## 2015-10-01 DIAGNOSIS — Y939 Activity, unspecified: Secondary | ICD-10-CM | POA: Diagnosis not present

## 2015-10-01 DIAGNOSIS — F17200 Nicotine dependence, unspecified, uncomplicated: Secondary | ICD-10-CM | POA: Insufficient documentation

## 2015-10-01 DIAGNOSIS — S0093XA Contusion of unspecified part of head, initial encounter: Secondary | ICD-10-CM

## 2015-10-01 DIAGNOSIS — Y929 Unspecified place or not applicable: Secondary | ICD-10-CM | POA: Insufficient documentation

## 2015-10-01 DIAGNOSIS — S0101XA Laceration without foreign body of scalp, initial encounter: Secondary | ICD-10-CM | POA: Insufficient documentation

## 2015-10-01 DIAGNOSIS — W228XXA Striking against or struck by other objects, initial encounter: Secondary | ICD-10-CM | POA: Insufficient documentation

## 2015-10-01 DIAGNOSIS — Y999 Unspecified external cause status: Secondary | ICD-10-CM | POA: Diagnosis not present

## 2015-10-01 MED ORDER — LIDOCAINE-EPINEPHRINE-TETRACAINE (LET) SOLUTION
NASAL | Status: AC
  Administered 2015-10-01: 3 mL via TOPICAL
  Filled 2015-10-01: qty 3

## 2015-10-01 MED ORDER — LIDOCAINE-EPINEPHRINE-TETRACAINE (LET) SOLUTION
3.0000 mL | Freq: Once | NASAL | Status: AC
  Administered 2015-10-01: 3 mL via TOPICAL

## 2015-10-01 MED ORDER — ACETAMINOPHEN 500 MG PO TABS
1000.0000 mg | ORAL_TABLET | Freq: Once | ORAL | Status: AC
  Administered 2015-10-01: 1000 mg via ORAL
  Filled 2015-10-01: qty 2

## 2015-10-01 NOTE — ED Notes (Signed)
Pt alert and oriented X4, active, cooperative, pt in NAD. RR even and unlabored, color WNL.  Pt informed to return if any life threatening symptoms occur.  Pt ambulatory.

## 2015-10-01 NOTE — ED Provider Notes (Signed)
[facility] Emergency Department Provider Note ____________________________________________  Time seen: 2050  I have reviewed the triage vital signs and the nursing notes.  HISTORY  Chief Complaint  Head Laceration and Headache  HPI Katelyn Green is a 45 y.o. female presents to the ED for evaluation of laceration to top of her head and she actually hit the head of the hatchback of her car. The injury happened about 7:50, about an hour prior to arrival. Patient denies any LOC, , dizziness, or vomiting. She does report a mild headache at this time secondary to the pain. Denies any other injury at this time. He rates the pain at about 8/10 in triage. Bleeding is currently controlled.  Past Medical History  Diagnosis Date  . 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 Cholecystitis, acute 11/05/2012  . Calculus of gallbladder with acute cholecystitis, without mention of obstruction 11/05/2012    Past Surgical History  Procedure Laterality Date  . Knee surgery  2004  . Ovary surgery Right 2006  . Cholecystectomy  2014    Current Outpatient Rx  Name  Route  Sig  Dispense  Refill  . Norgestimate-Eth Estradiol (SPRINTEC 28 PO)   Oral   Take by mouth.         . oxyCODONE-acetaminophen (PERCOCET/ROXICET) 5-325 MG per tablet   Oral   Take 40 tablets by mouth daily as needed.         . venlafaxine (EFFEXOR) 25 MG tablet   Oral   Take 25 mg by mouth 2 (two) times daily.          Allergies Codeine  No family history on file.  Social History Social History  Substance Use Topics  . Smoking status: Current Every Day Smoker  . Smokeless tobacco: Never Used  . Alcohol Use: Yes   Review of Systems  Constitutional: Negative for fever. Eyes: Negative for visual changes. ENT: Negative for sore throat. Cardiovascular: Negative for chest pain. Respiratory: Negative for shortness of  breath. Gastrointestinal: Negative for abdominal pain, vomiting and diarrhea. Genitourinary: Negative for dysuria. Musculoskeletal: Negative for back pain. Skin: Negative for rash.Scalp laceration as above. Neurological: Negative for focal weakness or numbness. reports headache. ____________________________________________  PHYSICAL EXAM:  VITAL SIGNS: ED Triage Vitals  Enc Vitals Group     BP 10/01/15 2115 122/71 mmHg     Pulse Rate 10/01/15 2115 72     Resp 10/01/15 2115 16     Temp 10/01/15 2115 98.5 F (36.9 C)     Temp Source 10/01/15 2115 Oral     SpO2 10/01/15 2115 99 %     Weight 10/01/15 2115 130 lb (58.968 kg)     Height 10/01/15 2115 5' 5"  (1.651 m)     Head Cir --      Peak Flow --      Pain Score 10/01/15 2115 8     Pain Loc --      Pain Edu? --      Excl. in [place]? --    Constitutional: Alert and oriented. Well appearing and in no distress. Head: Normocephalic and atraumaticExcept for a linear laceration measuring about 3 cm to the top of the scalp. Bleeding is currently controlled..      Eyes: Conjunctivae are normal. PERRL. Normal extraocular movements      Ears: Canals clear. TMs intact bilaterally.   Mouth/Throat: Mucous membranes are moist.   Neck: Supple. No  thyromegaly. Hematological/Lymphatic/Immunological: No cervical lymphadenopathy. Cardiovascular: Normal rate, regular rhythm.  Respiratory: Normal respiratory effort.  Musculoskeletal: Nontender with normal range of motion in all extremities.  Neurologic:  Normal gait without ataxia. Normal speech and language. No gross focal neurologic deficits are appreciated. Skin:  Skin is warm, dry and intact. No rash noted. Psychiatric: Mood and affect are normal. Patient exhibits appropriate insight and judgment. ____________________________________________  PROCEDURES  Tylenol 1000 mg PO  LACERATION REPAIR Performed by: Melvenia Needles Authorized by: Melvenia Needles Consent: Verbal  consent obtained. Risks and benefits: risks, benefits and alternatives were discussed Consent given by: patient Patient identity confirmed: provided demographic data Prepped and Draped in normal sterile fashion Wound explored  Laceration Location: scalp  Laceration Length: 3cm  No Foreign Bodies seen or palpated  Anesthesia: local infiltration  Local anesthetic: lidocaine-epinephrine-tetracaine  Anesthetic total: 5 ml  Irrigation method: syringe Amount of cleaning: standard  Skin closure: wound adhesive  Patient tolerance: Patient tolerated the procedure well with no immediate complications. ____________________________________________  INITIAL IMPRESSION / ASSESSMENT AND PLAN / ED COURSE  Patient with a minor head contusion resulting in a scalp laceration status post staple repair. She is discharged with instructions for wound care management as well as admission regarding minor head injury and mild concussive syndrome. She should return to the ED for any worsening symptoms as discussed. She can dosed Tylenol over-the-counter for pain relief necessary. She will follow-up with [facility] for staple removal in 1 week. ____________________________________________  FINAL CLINICAL IMPRESSION(S) / ED DIAGNOSES  Final diagnoses:  Scalp laceration, initial encounter  Head contusion, initial encounter      Melvenia Needles, PA-C 10/01/15 [place], MD 10/02/15 [phone]

## 2015-10-01 NOTE — ED Notes (Signed)
Laceration to top of head no bleeding currently.

## 2015-10-01 NOTE — ED Notes (Signed)
Laceration to top of head approx 1 inch on hatch of door of car. Pt now c/o headache from pain. Bleeding controlled at this time. No LOC. Pt alert and oriented X4, active, cooperative, pt in NAD. RR even and unlabored, color WNL.

## 2015-10-01 NOTE — Discharge Instructions (Signed)
Facial or Scalp Contusion  A facial or scalp contusion is a deep bruise on the face or head. Contusions happen when an injury causes bleeding under the skin. Signs of bruising include pain, puffiness (swelling), and discolored skin. The contusion may turn blue, purple, or yellow. HOME CARE  Only take medicines as told by your doctor.  Put ice on the injured area.  Put ice in a plastic bag.  Place a towel between your skin and the bag.  Leave the ice on for 20 minutes, 2-3 times a day. GET HELP IF:  You have bite problems.  You have pain when chewing.  You are worried about your face not healing normally. GET HELP RIGHT AWAY IF:   You have severe pain or a headache and medicine does not help.  You are very tired or confused, or your personality changes.  You throw up (vomit).  You have a nosebleed that will not stop.  You see two of everything (double vision) or have blurry vision.  You have fluid coming from your nose or ear.  You have problems walking or using your arms or legs. MAKE SURE YOU:   Understand these instructions.  Will watch your condition.  Will get help right away if you are not doing well or get worse.   This information is not intended to replace advice given to you by your health care provider. Make sure you discuss any questions you have with your health care provider.   Document Released: 05/23/2011 Document Revised: 06/24/2014 Document Reviewed: 01/14/2013 Elsevier Interactive Patient Education 2016 [place], Adult A laceration is a cut that goes through all layers of the skin. The cut also goes into the tissue that is right under the skin. Some cuts heal on their own. Others need to be closed with stitches (sutures), staples, skin adhesive strips, or wound glue. Taking care of your cut lowers your risk of infection and helps your cut to heal better. HOW TO TAKE CARE OF YOUR CUT For stitches or staples:  Keep the wound  clean and dry.  If you were given a bandage (dressing), you should change it at least one time per day or as told by your doctor. You should also change it if it gets wet or dirty.  Keep the wound completely dry for the first 24 hours or as told by your doctor. After that time, you may take a shower or a bath. However, make sure that the wound is not soaked in water until after the stitches or staples have been removed.  Clean the wound one time each day or as told by your doctor:  Wash the wound with soap and water.  Rinse the wound with water until all of the soap comes off.  Pat the wound dry with a clean towel. Do not rub the wound.  After you clean the wound, put a thin layer of antibiotic ointment on it as told by your doctor. This ointment:  Helps to prevent infection.  Keeps the bandage from sticking to the wound.  Have your stitches or staples removed as told by your doctor. If your doctor used skin adhesive strips:   Keep the wound clean and dry.  If you were given a bandage, you should change it at least one time per day or as told by your doctor. You should also change it if it gets dirty or wet.  Do not get the skin adhesive strips wet. You can take  a shower or a bath, but be careful to keep the wound dry.  If the wound gets wet, pat it dry with a clean towel. Do not rub the wound.  Skin adhesive strips fall off on their own. You can trim the strips as the wound heals. Do not remove any strips that are still stuck to the wound. They will fall off after a while. If your doctor used wound glue:  Try to keep your wound dry, but you may briefly wet it in the shower or bath. Do not soak the wound in water, such as by swimming.  After you take a shower or a bath, gently pat the wound dry with a clean towel. Do not rub the wound.  Do not do any activities that will make you really sweaty until the skin glue has fallen off on its own.  Do not apply liquid, cream, or  ointment medicine to your wound while the skin glue is still on.  If you were given a bandage, you should change it at least one time per day or as told by your doctor. You should also change it if it gets dirty or wet.  If a bandage is placed over the wound, do not let the tape for the bandage touch the skin glue.  Do not pick at the glue. The skin glue usually stays on for 5-10 days. Then, it falls off of the skin. General Instructions  To help prevent scarring, make sure to cover your wound with sunscreen whenever you are outside after stitches are removed, after adhesive strips are removed, or when wound glue stays in place and the wound is healed. Make sure to wear a sunscreen of at least 30 SPF.  Take over-the-counter and prescription medicines only as told by your doctor.  If you were given antibiotic medicine or ointment, take or apply it as told by your doctor. Do not stop using the antibiotic even if your wound is getting better.  Do not scratch or pick at the wound.  Keep all follow-up visits as told by your doctor. This is important.  Check your wound every day for signs of infection. Watch for:  Redness, swelling, or pain.  Fluid, blood, or pus.  Raise (elevate) the injured area above the level of your heart while you are sitting or lying down, if possible. GET HELP IF:  You got a tetanus shot and you have any of these problems at the injection site:  Swelling.  Very bad pain.  Redness.  Bleeding.  You have a fever.  A wound that was closed breaks open.  You notice a bad smell coming from your wound or your bandage.  You notice something coming out of the wound, such as wood or glass.  Medicine does not help your pain.  You have more redness, swelling, or pain at the site of your wound.  You have fluid, blood, or pus coming from your wound.  You notice a change in the color of your skin near your wound.  You need to change the bandage often because  fluid, blood, or pus is coming from the wound.  You start to have a new rash.  You start to have numbness around the wound. GET HELP RIGHT AWAY IF:  You have very bad swelling around the wound.  Your pain suddenly gets worse and is very bad.  You notice painful lumps near the wound or on skin that is anywhere on your body.  You  have a red streak going away from your wound.  The wound is on your hand or foot and you cannot move a finger or toe like you usually can.  The wound is on your hand or foot and you notice that your fingers or toes look pale or bluish.   This information is not intended to replace advice given to you by your health care provider. Make sure you discuss any questions you have with your health care provider.   Document Released: 11/20/2007 Document Revised: 10/18/2014 Document Reviewed: 05/30/2014 Elsevier Interactive Patient Education 2016 [place], or Adhesive Wound Closure Doctors use stitches (sutures), staples, and certain glue (skin adhesives) to hold your skin together while it heals (wound closure). You may need this treatment after you have surgery or if you cut your skin accidentally. These methods help your skin heal more quickly. They also make it less likely that you will have a scar. WHAT ARE THE DIFFERENT KINDS OF WOUND CLOSURES? There are many options for wound closure. The one that your doctor uses depends on how deep and large your wound is. Adhesive Glue To use this glue to close a wound, your doctor holds the edges of the wound together and paints the glue on the surface of your skin. You may need more than one layer of glue. Then the wound may be covered with a light bandage (dressing). This type of skin closure may be used for small wounds that are not deep (superficial). Using glue for wound closure is less painful than other methods. It does not require a medicine that numbs the area. This method also leaves nothing  to be removed. Adhesive glue is often used for children and on facial wounds. Adhesive glue cannot be used for wounds that are deep, uneven, or bleeding. It is not used inside of a wound.  Adhesive Strips These strips are made of sticky (adhesive), porous paper. They are placed across your skin edges like a regular adhesive bandage. You leave them on until they fall off. Adhesive strips may be used to close very superficial wounds. They may also be used along with sutures to improve closure of your skin edges.  Sutures Sutures are the oldest method of wound closure. Sutures can be made from natural or synthetic materials. They can be made from a material that your body can break down as your wound heals (absorbable), or they can be made from a material that needs to be removed from your skin (nonabsorbable). They come in many different strengths and sizes. Your doctor attaches the sutures to a steel needle on one end. Sutures can be passed through your skin, or through the tissues beneath your skin. Then they are tied and cut. Your skin edges may be closed in one continuous stitch or in separate stitches. Sutures are strong and can be used for all kinds of wounds. Absorbable sutures may be used to close tissues under the skin. The disadvantage of sutures is that they may cause skin reactions that lead to infection. Nonabsorbable sutures need to be removed. Staples When surgical staples are used to close a wound, the edges of your skin on both sides of the wound are brought close together. A staple is placed across the wound, and an instrument secures the edges together. Staples are often used to close surgical cuts (incisions). Staples are faster to use than sutures, and they cause less reaction from your skin. Staples need to be removed using a tool  that bends the staples away from your skin. HOW DO I CARE FOR MY WOUND CLOSURE?  Take medicines only as told by your doctor.  If you were prescribed an  antibiotic medicine for your wound, finish it all even if you start to feel better.  Use ointments or creams only as told by your doctor.  Wash your hands with soap and water before and after touching your wound.  Do not soak your wound in water. Do not take baths, swim, or use a hot tub until your doctor says it is okay.  Ask your doctor when you can start showering. Cover your wound if told by your doctor.  Do not take out your own sutures or staples.  Do not pick at your wound. Picking can cause an infection.  Keep all follow-up visits as told by your doctor. This is important. HOW LONG WILL I HAVE MY WOUND CLOSURE?   Leave adhesive glue on your skin until the glue peels away.  Leave adhesive strips on your skin until they fall off.  Absorbable sutures will dissolve within several days.  Nonabsorbable sutures and staples must be removed. The location of the wound will determine how long they stay in. This can range from several days to a couple of weeks. WHEN SHOULD I SEEK HELP FOR MY WOUND CLOSURE? Contact your doctor if:  You have a fever.  You have chills.  You have redness, puffiness (swelling), or pain at the site of your wound.  You have fluid, blood, or pus coming from your wound.  There is a bad smell coming from your wound.  The skin edges of your wound start to separate after your sutures have been removed.  Your wound becomes thick, raised, and darker in color after your sutures come out (scarring).   This information is not intended to replace advice given to you by your health care provider. Make sure you discuss any questions you have with your health care provider.   Document Released: 03/31/2009 Document Revised: 06/24/2014 Document Reviewed: 11/10/2013 Elsevier Interactive Patient Education 2016 [place] with [facility] in 1 week for staple removal.

## 2017-03-21 ENCOUNTER — Encounter: Payer: Self-pay | Admitting: Emergency Medicine

## 2017-03-21 ENCOUNTER — Emergency Department
Admission: EM | Admit: 2017-03-21 | Discharge: 2017-03-21 | Disposition: A | Discharge status: Home / Self Care | Payer: 59 | Attending: Emergency Medicine | Admitting: Emergency Medicine

## 2017-03-21 ENCOUNTER — Emergency Department: Payer: 59

## 2017-03-21 DIAGNOSIS — F17200 Nicotine dependence, unspecified, uncomplicated: Secondary | ICD-10-CM | POA: Diagnosis not present

## 2017-03-21 DIAGNOSIS — Z79899 Other long term (current) drug therapy: Secondary | ICD-10-CM | POA: Diagnosis not present

## 2017-03-21 DIAGNOSIS — M25561 Pain in right knee: Secondary | ICD-10-CM | POA: Diagnosis present

## 2017-03-21 MED ORDER — OXYCODONE-ACETAMINOPHEN 5-325 MG PO TABS
1.0000 | ORAL_TABLET | Freq: Once | ORAL | Status: AC
  Administered 2017-03-21: 1 via ORAL
  Filled 2017-03-21: qty 1

## 2017-03-21 MED ORDER — ONDANSETRON 4 MG PO TBDP
4.0000 mg | ORAL_TABLET | Freq: Once | ORAL | Status: AC
  Administered 2017-03-21: 4 mg via ORAL
  Filled 2017-03-21: qty 1

## 2017-03-21 MED ORDER — OXYCODONE-ACETAMINOPHEN 5-325 MG PO TABS: 1.0000 | ORAL_TABLET | Freq: Four times a day (QID) | ORAL | 0 refills | Status: AC | PRN

## 2017-03-21 NOTE — ED Triage Notes (Signed)
Pt to triage via wheelchair. Pt reports about an hour ago she was hit by her dog on the side of her right knee. Pt states it knocked her down and she has been unable to bear weight since. Pt has had surgery on that knee about 13 years ago. CMS intact.

## 2017-03-21 NOTE — ED Notes (Signed)
Pt was playing with her dogs this evening when her legs were knocked out from her and she fell onto her right knee. Pt is in NAD at this time.

## 2017-03-22 NOTE — ED Provider Notes (Signed)
[facility] Emergency Department Provider Note  ____________________________________________  Time seen: Approximately 12:09 AM  I have reviewed the triage vital signs and the nursing notes.   HISTORY  Chief Complaint Knee Pain    HPI Katelyn Green is a 46 y.o. female presenting to the emergency department with 10 out of 10 right knee pain after patient states that she collided with 2 of her dogs this evening. Patient states that she has been unable to bear weight since the incident. She denies hitting her head or losing consciousness. She denies weakness, radiculopathy or changes in sensation of the lower extremities. No skin compromise. Patient has a history of chronic knee pain. Patient has had multiple knee surgeries in the past. No medications were attempted prior to presenting to the emergency department.   Past Medical History:  Diagnosis Date  . Kidney infarction [facility])     Patient Active Problem List   Diagnosis Date Noted  . Cholecystitis, acute 11/05/2012  . Calculus of gallbladder with acute cholecystitis, without mention of obstruction 11/05/2012    Past Surgical History:  Procedure Laterality Date  . CHOLECYSTECTOMY  2014  . KNEE SURGERY  2004  . OVARY SURGERY Right 2006    Prior to Admission medications   Medication Sig Start Date End Date Taking? Authorizing Provider  Norgestimate-Eth Estradiol (SPRINTEC 28 PO) Take by mouth.    [provider]  oxyCODONE-acetaminophen (ROXICET) 5-325 MG tablet Take 1 tablet by mouth every 6 (six) hours as needed for severe pain. 03/21/17 03/24/17  Lannie Fields, PA-C  venlafaxine (EFFEXOR) 25 MG tablet Take 25 mg by mouth 2 (two) times daily.    [provider]    Allergies Codeine  No family history on file.  Social History Social History  Substance Use Topics  . Smoking status: Current Every Day Smoker  . Smokeless tobacco: Never Used  . Alcohol use Yes     Review  of Systems  Constitutional: No fever/chills Eyes: No visual changes. No discharge ENT: No upper respiratory complaints. Cardiovascular: no chest pain. Respiratory: no cough. No SOB. Gastrointestinal: No abdominal pain.  No nausea, no vomiting.  No diarrhea.  No constipation. Musculoskeletal: She has right knee pain Skin: Negative for rash, abrasions, lacerations, ecchymosis. Neurological: Negative for headaches, focal weakness or numbness.  ____________________________________________   PHYSICAL EXAM:  VITAL SIGNS: ED Triage Vitals  Enc Vitals Group     BP 03/21/17 1939 (!) 106/54     Pulse Rate 03/21/17 1939 81     Resp 03/21/17 1939 20     Temp 03/21/17 1939 98.1 F (36.7 C)     Temp Source 03/21/17 1939 Oral     SpO2 03/21/17 1939 100 %     Weight 03/21/17 1939 130 lb (59 kg)     Height 03/21/17 1939 5' 5.5" (1.664 m)     Head Circumference --      Peak Flow --      Pain Score 03/21/17 1938 10     Pain Loc --      Pain Edu? --      Excl. in [place]? --      Constitutional: Alert and oriented. Well appearing and in no acute distress. Eyes: Conjunctivae are normal. PERRL. EOMI. Head: Atraumatic. Cardiovascular: Normal rate, regular rhythm. Normal S1 and S2.  Good peripheral circulation. Respiratory: Normal respiratory effort without tachypnea or retractions. Lungs CTAB. Good air entry to the bases with no decreased or absent breath sounds. Musculoskeletal:  Right knee. Patient can perform full range of motion. Pain is elicited to palpation over the MCL. No LCL laxity. Negative anterior and posterior drawer test. Positive ballottement. Positive apprehension. Palpable dorsalis pedis pulse, right Neurologic:  Normal speech and language. No gross focal neurologic deficits are appreciated.  Skin:  Skin is warm, dry and intact. No rash noted. Psychiatric: Mood and affect are normal. Speech and behavior are normal. Patient exhibits appropriate insight and  judgement.   ____________________________________________   LABS (all labs ordered are listed, but only abnormal results are displayed)  Labs Reviewed - No data to display ____________________________________________  EKG   ____________________________________________  RADIOLOGY Unk Pinto, personally viewed and evaluated these images (plain radiographs) as part of my medical decision making, as well as reviewing the written report by the radiologist.  Ct Knee Right Wo Contrast  Result Date: 03/21/2017 CLINICAL DATA:  Possible knee fracture, injury EXAM: CT OF THE right KNEE WITHOUT CONTRAST TECHNIQUE: Multidetector CT imaging of the right knee was performed according to the standard protocol. Multiplanar CT image reconstructions were also generated. COMPARISON:  Radiographs 03/21/2016 FINDINGS: Bones/Joint/Cartilage Well corticated ossicles or old injuries along the medial surface of the patella. Articular surface cortical irregularity of the patella on the medial side could represent age indeterminate chondral surface fracture of the patella. There is shallow trochlear groove of the distal femur with mild lateral subluxation of the patella. There are no other discrete acute displaced fracture identified. Cortical surface irregularity of the posterior, lateral femoral condyle. Small joint effusion. Multiple joint space calcifications compatible with chondrocalcinosis. Along the medial, anterior lateral femoral condyles is a 1 cm linear calcification, corresponding to the radiographic abnormality, possibly representing a small loose body. Ligaments Suboptimally assessed by CT. Possible calcification in the region of the posterior cruciate ligament. Muscles and Tendons Normal muscle bulk at the knee. Quadriceps tendon and patellar tendon appear grossly intact. Soft tissues Soft tissue thickening along the medial aspect of the patella. IMPRESSION: 1. Mild cortical surface irregularity of  the medial facet of the patella, possibly due to a fracture. There is mild lateral subluxation of the patella with shallow trochlear groove of the femur. Soft tissue edema and thickening medial to the patella in the region of the retinaculum. MRI would better evaluate for lateral patellar dislocation. 2. Linear calcification along the medial, anterior aspect of the lateral femoral condyle. There is mild subarticular surface irregularity of the posterior, lateral femoral condyle ; findings could be secondary to osteochondral lesion with possible loose body. MRI would be more definitive for diagnosis. 3. Multiple joint space calcifications consistent with chondrocalcinosis 4. Joint effusion Electronically Signed   By: Donavan Foil M.D.   On: 03/21/2017 22:19   Dg Knee Complete 4 Views Right  Result Date: 03/21/2017 CLINICAL DATA:  Right knee pain after fall. EXAM: RIGHT KNEE - COMPLETE 4+ VIEW COMPARISON:  None. FINDINGS: Linear ossific density along the mesial and anterior aspect of the lateral femoral condyle. Small knee joint effusion. Joint spaces are relatively preserved. Chondrocalcinosis of the menisci. Bone mineralization is normal. Soft tissues are unremarkable. IMPRESSION: 1. Linear ossific density along the mesial and anterior aspect of the lateral femoral condyle is nonspecific and could represent a small avulsion fragment in the setting of a small joint effusion. Consider CT for further evaluation. 2. Chondrocalcinosis, suggestive of CPPD arthropathy. Electronically Signed   By: Titus Dubin M.D.   On: 03/21/2017 20:38    ____________________________________________    PROCEDURES  Procedure(s) performed:  Procedures    Medications  oxyCODONE-acetaminophen (PERCOCET/ROXICET) 5-325 MG per tablet 1 tablet (1 tablet Oral Given 03/21/17 2256)  ondansetron (ZOFRAN-ODT) disintegrating tablet 4 mg (4 mg Oral Given 03/21/17 2256)      ____________________________________________   INITIAL IMPRESSION / ASSESSMENT AND PLAN / ED COURSE  Pertinent labs & imaging results that were available during my care of the patient were reviewed by me and considered in my medical decision making (see chart for details).  Review of the  [place]RS was performed in accordance of the [place] prior to dispensing any controlled drugs.     Assessment and plan Right knee pain Patient presents to the emergency department with right knee pain after patient collided with her dogs this evening. CT right knee reveals possible loose body and patella fracture. Patella fracture was not correlated on physical exam. A knee immobilizer was provided the emergency department as well as Roxicet. Patient was referred to orthopedics, Dr. Christella Hartigan at patient's request. Vital signs are reassuring prior to discharge. All patient questions were answered.  ____________________________________________  FINAL CLINICAL IMPRESSION(S) / ED DIAGNOSES  Final diagnoses:  Acute pain of right knee      NEW MEDICATIONS STARTED DURING THIS VISIT:  Discharge Medication List as of 03/21/2017 10:49 PM          This chart was dictated using voice recognition software/Dragon. Despite best efforts to proofread, errors can occur which can change the meaning. Any change was purely unintentional.    Lannie Fields, PA-C 03/22/17 0014    Earleen Newport, MD 03/23/17 [phone]

## 2019-06-03 ENCOUNTER — Other Ambulatory Visit: Payer: Self-pay | Admitting: Family Medicine

## 2019-06-03 DIAGNOSIS — Z1231 Encounter for screening mammogram for malignant neoplasm of breast: Secondary | ICD-10-CM

## 2019-06-23 ENCOUNTER — Other Ambulatory Visit: Payer: Self-pay

## 2019-06-23 ENCOUNTER — Ambulatory Visit
Admission: RE | Admit: 2019-06-23 | Discharge: 2019-06-23 | Disposition: A | Discharge status: Home / Self Care | Payer: 59 | Source: Ambulatory Visit | Attending: Family Medicine | Admitting: Family Medicine

## 2019-06-23 DIAGNOSIS — Z1231 Encounter for screening mammogram for malignant neoplasm of breast: Secondary | ICD-10-CM | POA: Diagnosis present

## 2020-09-14 ENCOUNTER — Other Ambulatory Visit: Payer: Self-pay | Admitting: Family Medicine

## 2021-10-14 LAB — EXTERNAL GENERIC LAB PROCEDURE: COLOGUARD: NEGATIVE
